# Patient Record
Sex: Female | Born: 1980 | Race: White | Hispanic: No | Marital: Married
Health system: Southern US, Community
[De-identification: ages and names within clinical notes are randomized; demographics above are authoritative.]

## PROBLEM LIST (undated history)

## (undated) HISTORY — PX: TUBAL LIGATION: SHX77

---

## 2006-09-01 ENCOUNTER — Emergency Department: Payer: Self-pay | Admitting: Emergency Medicine

## 2007-06-23 ENCOUNTER — Inpatient Hospital Stay: Payer: Self-pay | Admitting: Internal Medicine

## 2007-06-23 ENCOUNTER — Other Ambulatory Visit: Payer: Self-pay

## 2007-06-24 ENCOUNTER — Inpatient Hospital Stay: Payer: Self-pay | Admitting: Psychiatry

## 2008-11-29 ENCOUNTER — Emergency Department: Payer: Self-pay | Admitting: Emergency Medicine

## 2009-07-02 IMAGING — CT CT HEAD WITHOUT CONTRAST
2 series · 16 of 30 positions shown, 20 images · non-contrast
Comparison: none

REASON FOR EXAM: AMS
COMMENTS:

[Series 2: without · axial · non-contrast · 0.39mm/px · z∈[+887,+1007]mm · 13 of 30 slices shown, 17 images]
[im 3/30  brain]
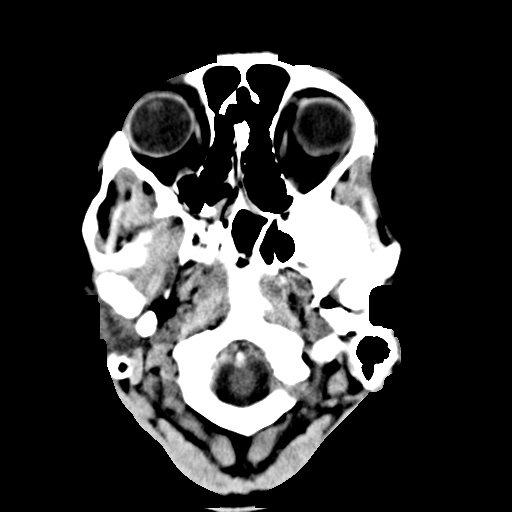
[im 3/30  bone]
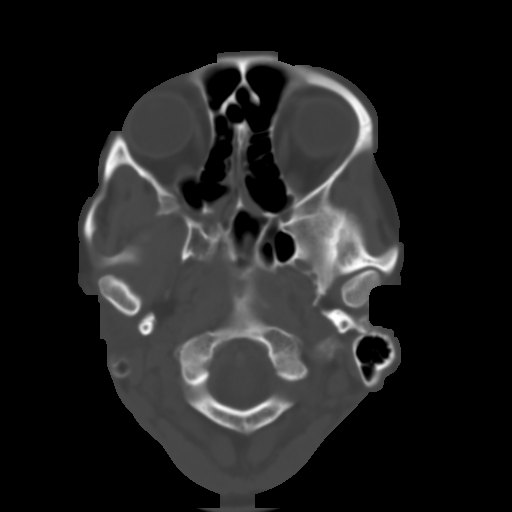
[im 5/30  brain]
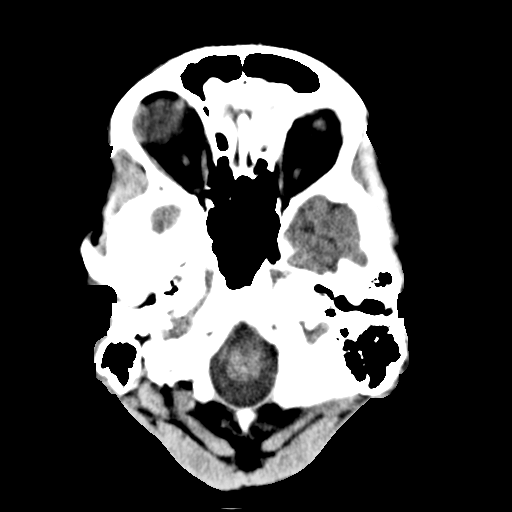
[im 7/30  brain]
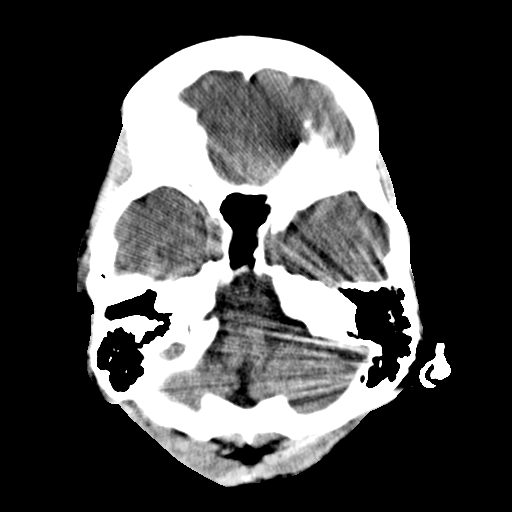
[im 9/30  brain]
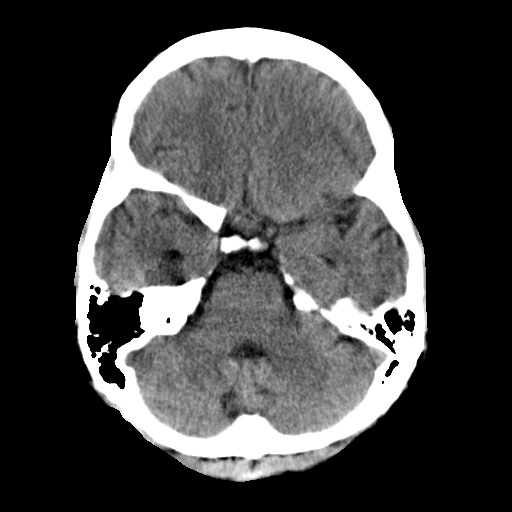
[im 11/30  brain]
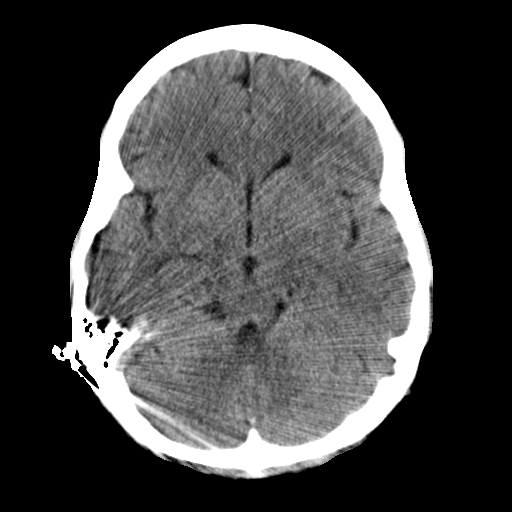
[im 11/30  bone]
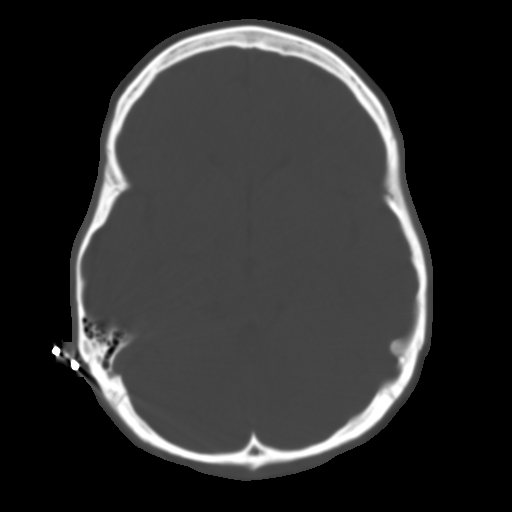
[im 13/30  brain]
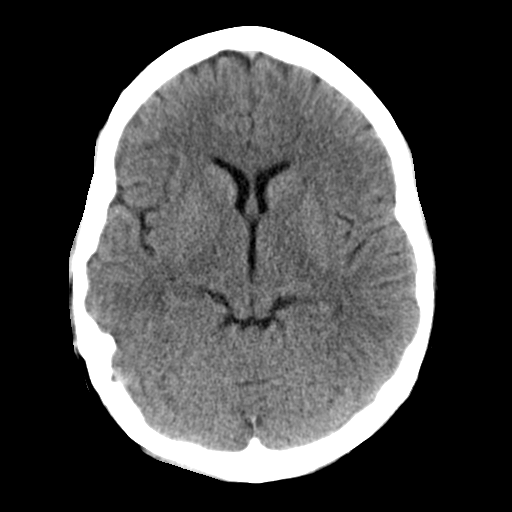
[im 15/30  brain]
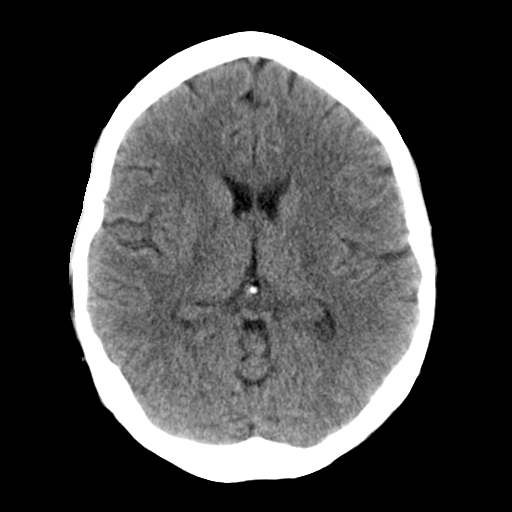
[im 17/30  brain]
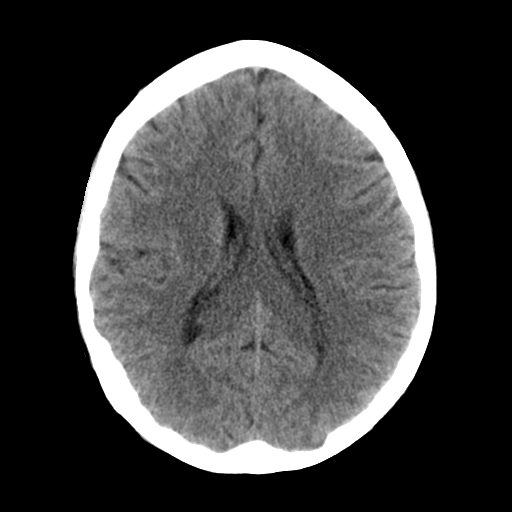
[im 19/30  brain]
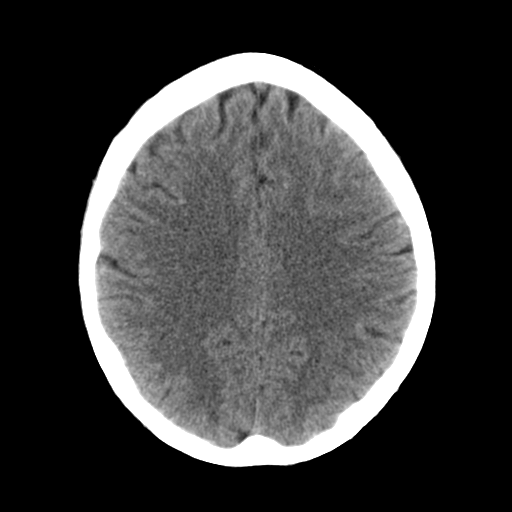
[im 19/30  bone]
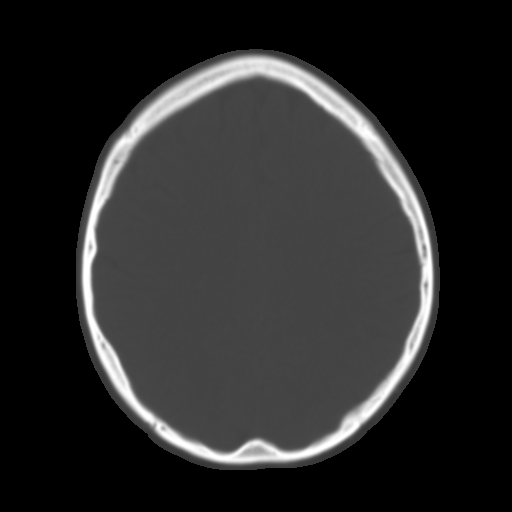
[im 21/30  brain]
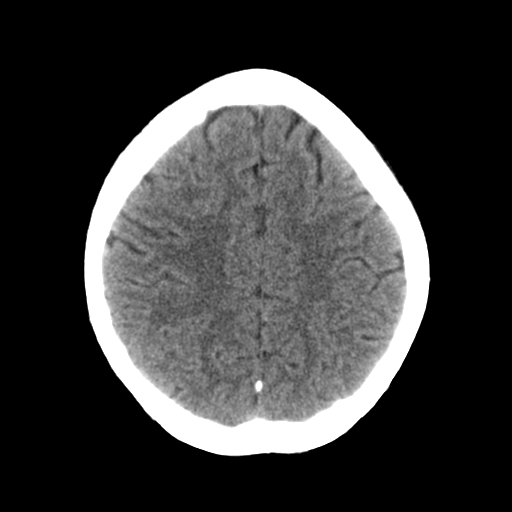
[im 23/30  brain]
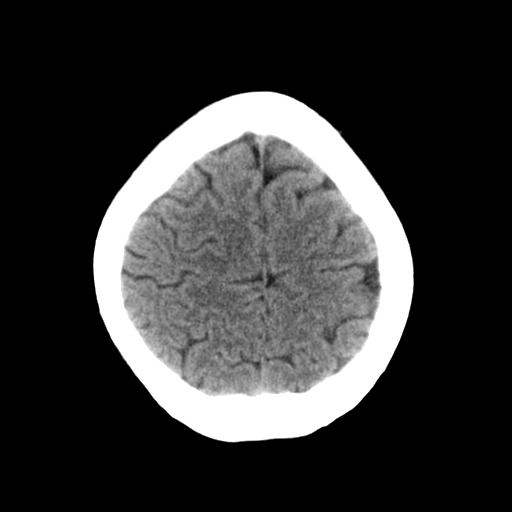
[im 25/30  brain]
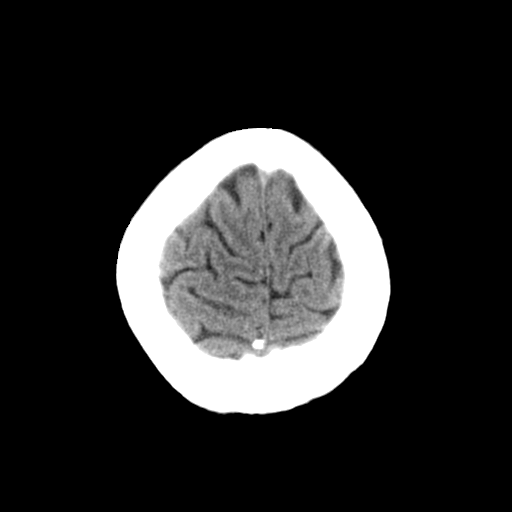
[im 27/30  brain]
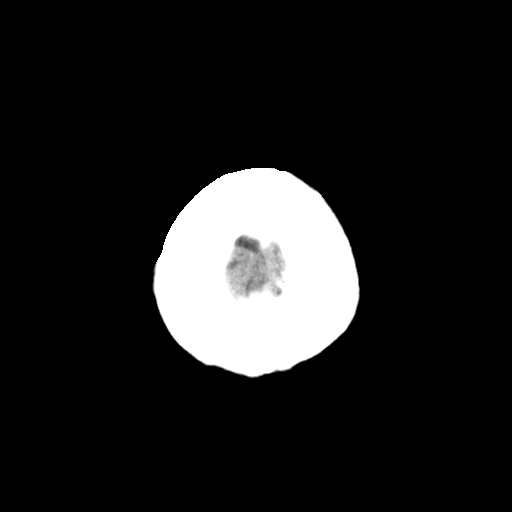
[im 27/30  bone]
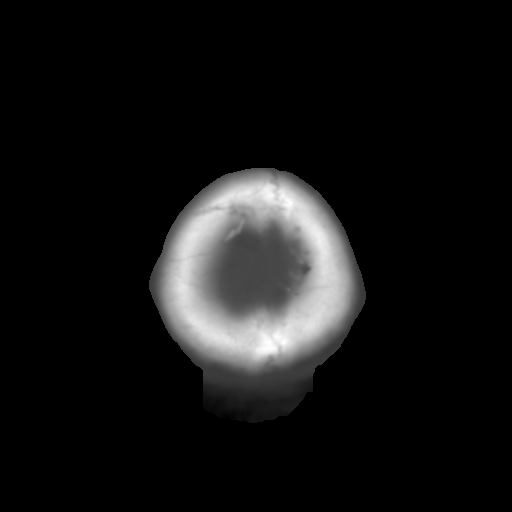

[Series 3: bone · axial · 0.39mm/px · z∈[+887,+927]mm · 3 of 30 slices shown]
[im 3/30  bone]
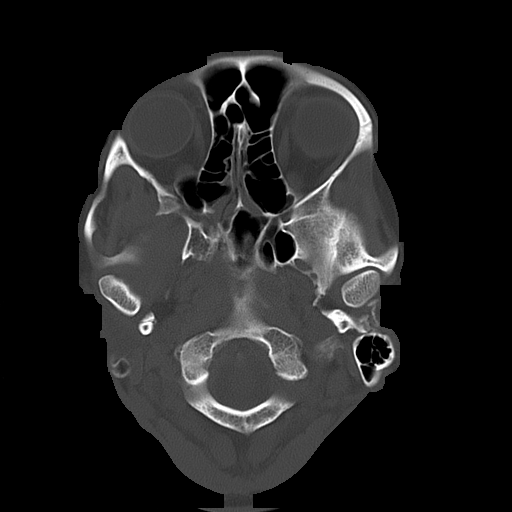
[im 7/30  bone]
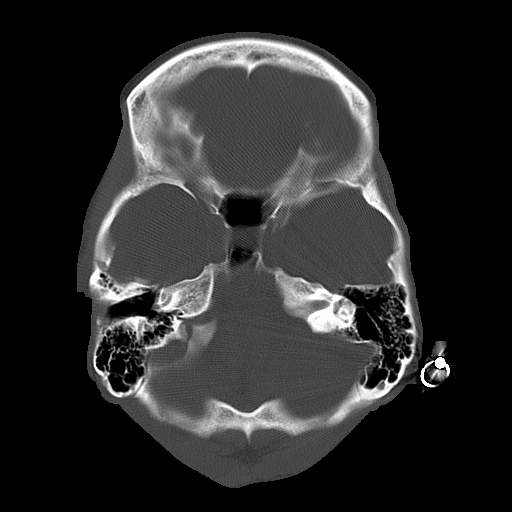
[im 11/30  bone]
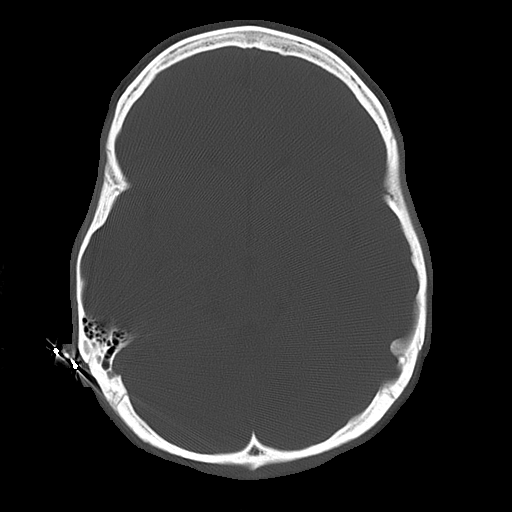

[16 of 30 positions shown; findings below may reference images not displayed]

PROCEDURE:     CT  - CT HEAD WITHOUT CONTRAST  - June 23, 2007  [DATE]

RESULT:     Comparison is made to a study 02 September, 2006.

The ventricles are normal in size and position. There is no intracranial
hemorrhage, mass, or mass-effect. There is some limitation of the study
along the base of the brain due to a metallic ring in the upper aspect of
the right ear. At bone window settings I do not see evidence of an acute
skull fracture. The observed portions of the paranasal sinuses are clear.
IMPRESSION: 1. I do not see evidence of an acute ischemic or hemorrhagic event.
2. There is no evidence of an acute skull fracture.

This report was called to the emergency department the conclusion of study.

## 2009-07-02 IMAGING — CR DG CHEST 1V PORT
1 series · 1 of 1 positions shown · non-contrast
Comparison: none

REASON FOR EXAM: Post intubation
COMMENTS:

[view not recorded]
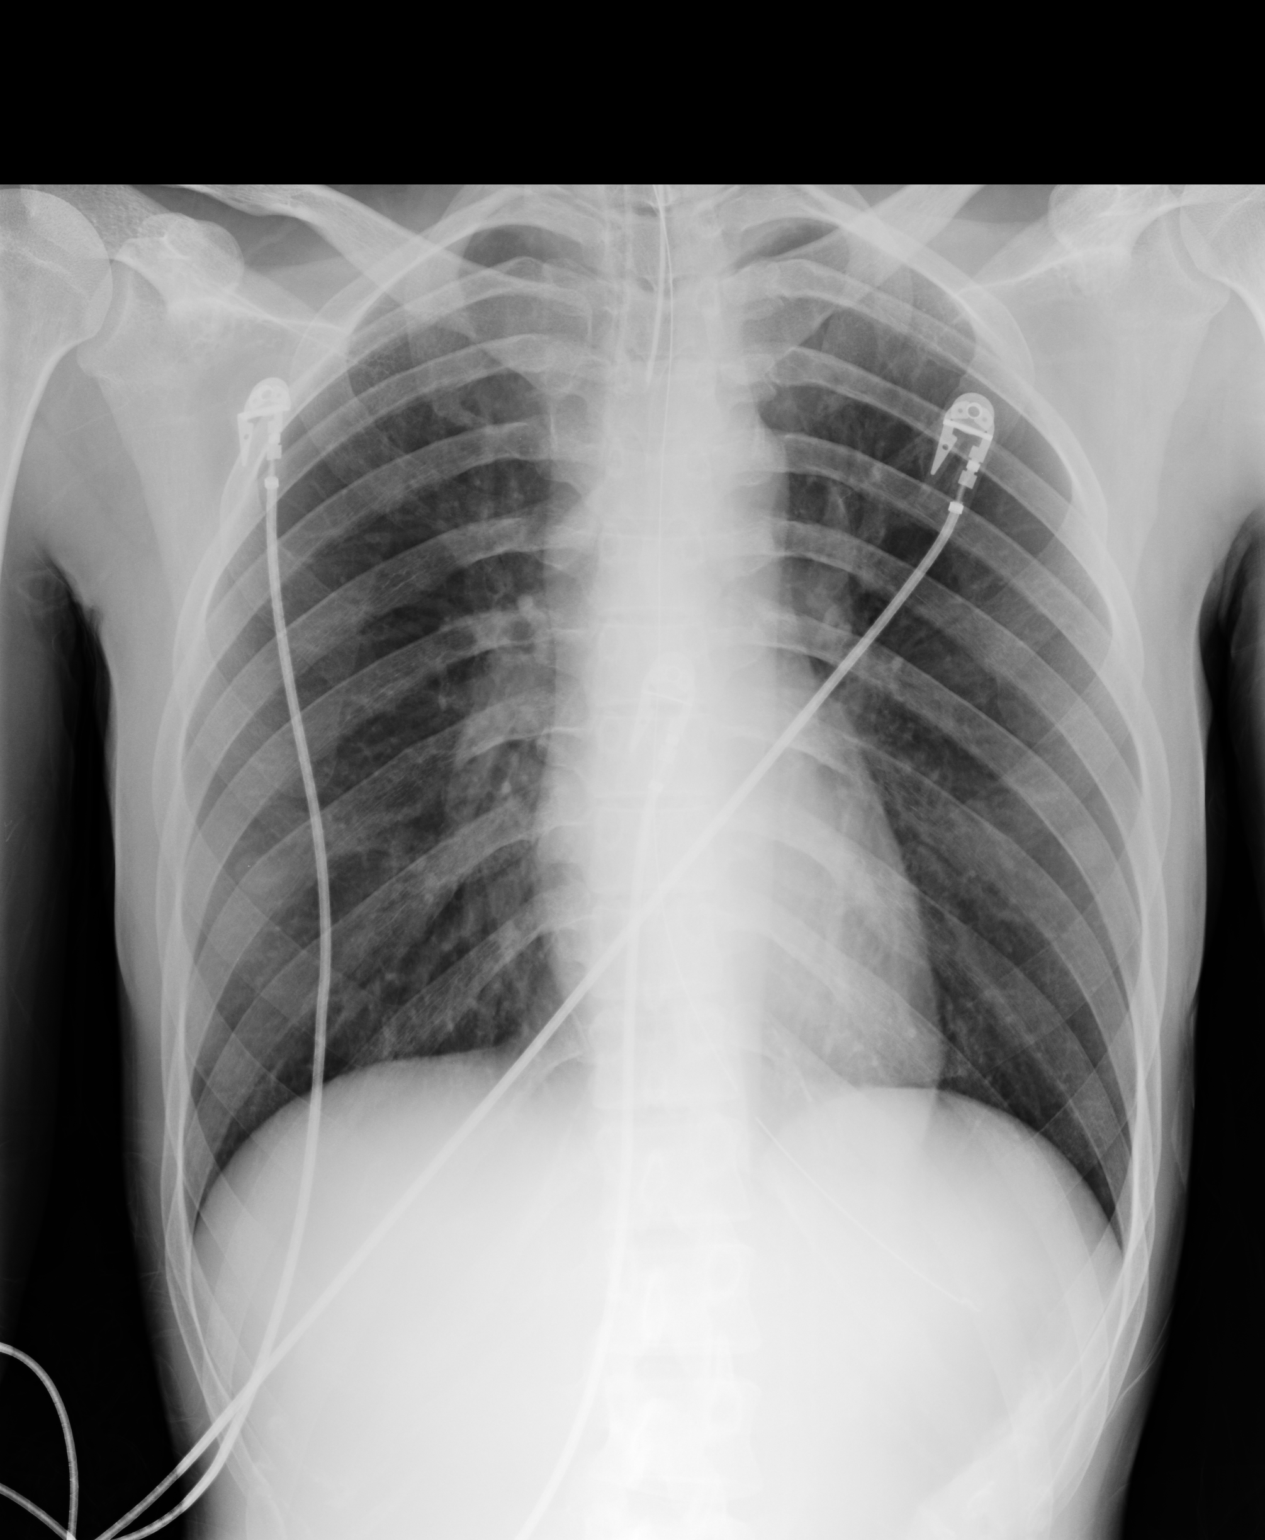

[1 of 1 positions shown; findings below may reference images not displayed]

PROCEDURE:     DXR - DXR PORTABLE CHEST SINGLE VIEW  - June 23, 2007 [DATE]

RESULT:     An endotracheal tube is present with the tip approximately
cm above the carina. A nasogastric tube is present with the distal portion
in the stomach. The lung fields are clear. The heart size is normal.
Monitoring electrodes are present.
IMPRESSION: Please see above.

## 2009-12-25 ENCOUNTER — Inpatient Hospital Stay: Payer: Self-pay

## 2012-09-04 ENCOUNTER — Other Ambulatory Visit: Payer: Self-pay | Admitting: Obstetrics and Gynecology

## 2012-09-10 ENCOUNTER — Ambulatory Visit: Payer: Self-pay | Admitting: Obstetrics and Gynecology

## 2012-09-10 LAB — CBC WITH DIFFERENTIAL/PLATELET
Basophil #: 0.1 10*3/uL (ref 0.0–0.1)
Eosinophil #: 0.2 10*3/uL (ref 0.0–0.7)
Eosinophil %: 1.5 %
HGB: 13.5 g/dL (ref 12.0–16.0)
Lymphocyte #: 1.4 10*3/uL (ref 1.0–3.6)
Lymphocyte %: 13.4 %
MCH: 30.8 pg (ref 26.0–34.0)
MCHC: 34.6 g/dL (ref 32.0–36.0)
MCV: 89 fL (ref 80–100)
Monocyte #: 0.7 x10 3/mm (ref 0.2–0.9)
Monocyte %: 6.8 %
Neutrophil #: 8 10*3/uL — ABNORMAL HIGH (ref 1.4–6.5)
Platelet: 306 10*3/uL (ref 150–440)
RBC: 4.37 10*6/uL (ref 3.80–5.20)
RDW: 13.4 % (ref 11.5–14.5)
WBC: 10.3 10*3/uL (ref 3.6–11.0)

## 2012-09-11 ENCOUNTER — Inpatient Hospital Stay: Payer: Self-pay | Admitting: Obstetrics and Gynecology

## 2012-09-14 LAB — PATHOLOGY REPORT

## 2014-08-05 NOTE — Op Note (Signed)
PATIENT NAME:  Bonnie Skinner, Bonnie Skinner MR#:  [id number] DATE OF BIRTH:  1981-01-17  DATE OF PROCEDURE:  09/13/2012  PREOPERATIVE DIAGNOSES: 1.  Postoperative day 2 after repeat low transverse cesarean.  2.  Wound hematoma throughout entire incision that was evacuated yesterday and packed last evening.   POSTOPERATIVE DIAGNOSES: 1.  Postoperative day 2 after repeat low transverse cesarean.  2.  Wound hematoma throughout entire incision that was evacuated yesterday and packed last evening.  3.   Small arterial pumper near right apex of wound.   PROCEDURE:   1.  Evacuation of the hematoma.  2.  Ligation of arterial pumper.  3.  Closure of wound.   SURGEON: Ricky L. Logan BoresEvans, MD   ASSISTANT: Lawerance Sabalebbie Swist on the floor   ESTIMATED BLOOD LOSS: Minimal.   COMPLICATIONS: None.   SPECIMENS: None.   DRAINS: None.   PROCEDURE IN DETAIL: The patient has had a wound hematoma and has been followed since the evening of surgery, evacuated yesterday and packed.  This morning, we removed the  packing.  A small arterial pumper was seen and immediately toward the right apex. I reached a Hemostat to grasp it and on return it had already spasmed shut; therefore, we did gathering sutures in the cephalad and caudad  portion in this region and no further oozing was noted. The packing appeared to do an acceptable job of clearing the edges, and the incision was closed with deep vertical mattress sutures of 3-0 Vicryl, and then a running subcuticular 4-0 Vicryl was used to close the skin, and pressure dressing was applied.   The patient tolerated the procedure well. The wound was prepped with Betadine and numbed with approximately 25 mL of 1% lidocaine, 21 of which had epinephrine, 4 push did not.  She tolerated the procedure very well, and I anticipated a routine postoperative course.   We informed the patient that there was at least a 50-50 chance we here going to have a persistent wound complication; the reason for  going through today's closure would be that hopefully any wound disruption would be smaller and localized instead of an entire wound as was the situation preop.   ____________________________ Clide Clifficky L. Logan BoresEvans, MD rle:cb Skinner: 09/13/2012 13:04:53 ET T: 09/13/2012 23:20:02 ET JOB#: [id number]  cc: Ricky L. Logan BoresEvans, MD, <Dictator> Augustina MoodICK L Renia Mikelson MD ELECTRONICALLY SIGNED 09/19/2012 11:09

## 2014-08-05 NOTE — Op Note (Signed)
PATIENT NAME:  Bonnie Skinner, [name] MR#:  [id number] DATE OF BIRTH:  19-Apr-1980  DATE OF PROCEDURE:  09/11/2012  PREOPERATIVE DIAGNOSES: 1.  Term gestation at 37 weeks and 2 days. 2.  Pregnancy-induced hypertension with history of severe preeclampsia at 33 weeks with previous pregnancy.  3.  Desires repeat cesarean section.  4.  Desires tubal ligation.   POSTOPERATIVE DIAGNOSES:  1.  Term gestation at 37 weeks and 2 days. 2.  Pregnancy-induced hypertension with history of severe preeclampsia at 33 weeks with previous pregnancy.  3.  Desires repeat cesarean section.  4.  Desires tubal ligation.   PROCEDURE: Repeat low transverse cesarean section and bilateral tubal ligation.   SURGEON: Ricky L. Logan BoresEvans, MD  ASSISTANT: Scrub.   ANESTHESIA: Spinal by Dr. Darleene CleaverVan Staveren.  FINDINGS: Postsurgical abdomen with moderate amount of scar tissue, grossly normal uterus, tubes and ovaries. Vigorous female infant weighing 3110 grams, Apgars 9 and 9.   ESTIMATED BLOOD LOSS: 650.   DRAINS:  Foley.   PAIN PUMP:  On-Q pump was placed.   SPECIMENS: Portion of bilateral fallopian tubes.  PROCEDURE IN DETAIL: The patient had been followed in the office for worsening blood pressure and this week her blood pressure was 150/98 with a previous being 144/89. Assessment for preeclampsia was negative. The patient was asymptomatic, but given good dating, her history of severe preeclampsia with previous pregnancy and rapidly worsening pressures elected to proceed with delivery. Discussed with the patient and family. They stated their understanding and desired to proceed. Consent was signed. She was taken to the operating room and placed in the supine position where anesthesia was initiated. She was then placed in the supine position, prepped and draped in the usual sterile fashion, and after insertion of Foley catheter and ensuring adequate anesthesia low transverse cesarean section was carried out in the usual fashion.  The uterus was exteriorized. The uterine cavity was curettaged with lap. The uterine incision was closed with running interlocking 0 chromic.   Bilateral tubal ligation was carried out in the usual fashion with double ligation of plain gut in a relatively avascular region in the mid ampullary portion of the tubes with portions sent for pathology.   Our attention was turned to the abdomen cavity, copiously irrigated, and surgical sites x 3 were seen to be hemostatic. The rectus muscle was hemostatic. The fascia was closed left to right with 0 Vicryl, sub-Q made hemostatic with cautery. Please note that the On-Q pump was placed subfascially prior to fascial closure. Skin was closed with surgical clips and silver catheters for the On-Q were fixated with skin glue, Steri-Strips and Tegaderm in the usual fashion. Catheters were bolused with 3 mL of 0.5% Sensorcaine.   The patient tolerated the procedure well. Two grams of Ancef was given IV preoperatively. I anticipate a routine postoperative course.  ____________________________ Reatha Harpsicky L. Logan BoresEvans, MD rle:sb D: 09/11/2012 11:52:27 ET T: 09/11/2012 12:19:32 ET JOB#: [id number]  cc: Ricky L. Logan BoresEvans, MD, <Dictator> Augustina MoodICK L Han Vejar MD ELECTRONICALLY SIGNED 09/12/2012 11:17

## 2016-08-25 ENCOUNTER — Emergency Department
Admission: EM | Admit: 2016-08-25 | Discharge: 2016-08-25 | Disposition: A | Discharge status: Home / Self Care | Payer: Managed Care, Other (non HMO) | Attending: Emergency Medicine | Admitting: Emergency Medicine

## 2016-08-25 ENCOUNTER — Encounter: Payer: Self-pay | Admitting: *Deleted

## 2016-08-25 DIAGNOSIS — S61215A Laceration without foreign body of left ring finger without damage to nail, initial encounter: Secondary | ICD-10-CM

## 2016-08-25 DIAGNOSIS — Z23 Encounter for immunization: Secondary | ICD-10-CM | POA: Insufficient documentation

## 2016-08-25 DIAGNOSIS — Y939 Activity, unspecified: Secondary | ICD-10-CM | POA: Insufficient documentation

## 2016-08-25 DIAGNOSIS — Y929 Unspecified place or not applicable: Secondary | ICD-10-CM | POA: Insufficient documentation

## 2016-08-25 DIAGNOSIS — W260XXA Contact with knife, initial encounter: Secondary | ICD-10-CM | POA: Diagnosis not present

## 2016-08-25 DIAGNOSIS — Y998 Other external cause status: Secondary | ICD-10-CM | POA: Insufficient documentation

## 2016-08-25 MED ORDER — BACITRACIN ZINC 500 UNIT/GM EX OINT
TOPICAL_OINTMENT | Freq: Two times a day (BID) | CUTANEOUS | Status: DC
  Administered 2016-08-25: 1 via TOPICAL
  Filled 2016-08-25: qty 0.9

## 2016-08-25 MED ORDER — TETANUS-DIPHTH-ACELL PERTUSSIS 5-2.5-18.5 LF-MCG/0.5 IM SUSP
0.5000 mL | Freq: Once | INTRAMUSCULAR | Status: AC
  Administered 2016-08-25: 0.5 mL via INTRAMUSCULAR
  Filled 2016-08-25: qty 0.5

## 2016-08-25 MED ORDER — LIDOCAINE HCL (PF) 1 % IJ SOLN
5.0000 mL | Freq: Once | INTRAMUSCULAR | Status: AC
  Administered 2016-08-25: 5 mL
  Filled 2016-08-25: qty 5

## 2016-08-25 MED ORDER — BACITRACIN ZINC 500 UNIT/GM EX OINT
TOPICAL_OINTMENT | CUTANEOUS | Status: AC
  Administered 2016-08-25: 1
  Filled 2016-08-25: qty 0.9

## 2016-08-25 NOTE — Discharge Instructions (Signed)
Return to primary care or emergency department for suture removal. If wound begins to show signs of infection return to the emergency department or follow up with your primary care provider.

## 2016-08-25 NOTE — ED Provider Notes (Signed)
[facility] Emergency Department Provider Note   ____________________________________________   I have reviewed the triage vital signs and the nursing notes.   HISTORY  Chief Complaint Laceration    HPI Bonnie Skinner is a 36 y.o. female presents with laceration to the left ring finger along the pad lateral to the nail bed. Patient reports slicing an onion and catching the tip of her finger with a kitchen knife. No nail or nail bed involvement. Patient denies any other injuries. Patient maintained hemorrhage control immediately following the injury. No cutaneous sensation deficits noted of the left hand digits.   History reviewed. No pertinent past medical history.  There are no active problems to display for this patient.   No past surgical history on file.  Prior to Admission medications   Not on File    Allergies Azithromycin  History reviewed. No pertinent family history.  Social History Social History  Substance Use Topics  . Smoking status: Not on file  . Smokeless tobacco: Not on file  . Alcohol use Not on file    Review of Systems Constitutional:  Negative for fever/chills Cardiovascular: Denies chest pain. Respiratory: Denies cough Denies shortness of breath. Musculoskeletal: Negative musculoskeletal complaints Skin: Left ring finger laceration Neurological: Negative for headaches.  Negative focal weakness or numbness.  ____________________________________________   PHYSICAL EXAM:  VITAL SIGNS: ED Triage Vitals  Enc Vitals Group     BP 08/25/16 1813 137/83     Pulse Rate 08/25/16 1813 75     Resp 08/25/16 1813 16     Temp 08/25/16 1813 98.4 F (36.9 C)     Temp Source 08/25/16 1813 Oral     SpO2 08/25/16 1813 100 %     Weight 08/25/16 1813 170 lb (77.1 kg)     Height 08/25/16 1813 5\' 7"  (1.702 m)     Head Circumference --      Peak Flow --      Pain Score 08/25/16 1809 6     Pain Loc --      Pain Edu? --    Excl. in GC? --     Constitutional: Alert and oriented. Well appearing and in no acute distress.  Head: Normocephalic and atraumatic. Eyes: Conjunctivae are normal. Cardiovascular: Normal rate, regular rhythm. Normal distal pulses. Respiratory: Normal respiratory effort. Musculoskeletal: Nontender with normal range of motion in all extremities. Neurologic: Normal speech and language. No gross focal neurologic deficits are appreciated.  Skin:  Skin is warm, dry and intact except left 4th digit distal laceration, along pad without nail involvement. No rash noted. Psychiatric: Mood and affect are normal. Patient exhibits appropriate insight and judgment. ____________________________________________   LABS (all labs ordered are listed, but only abnormal results are displayed)  Labs Reviewed - No data to display ____________________________________________  EKG None ____________________________________________  RADIOLOGY None ____________________________________________   PROCEDURES  Procedure(s) performed: LACERATION REPAIR Performed by: Clois Comber Authorized by: Clois Comber Consent: Verbal consent obtained. Risks and benefits: risks, benefits and alternatives were discussed Consent given by: patient Patient identity confirmed: provided demographic data Prepped and Draped in normal sterile fashion Wound explored  Laceration Location: left 4th digit DIP, pad  Laceration Length: 1.5 cm, flap  No Foreign Bodies seen or palpated  Anesthesia: transthecal digital block of left 4th digit  Local anesthetic: lidocaine 1%  Anesthetic total: 2.0 ml  Irrigation method: syringe Amount of cleaning: standard  Skin closure: Ethilon suture 5-0  Number of sutures: 3-sutures  Technique: simple interrupted  Patient tolerance: Patient tolerated the procedure well with no immediate complications.   Critical Care performed:  no ____________________________________________   INITIAL IMPRESSION / ASSESSMENT AND PLAN / ED COURSE  Pertinent labs & imaging results that were available during my care of the patient were reviewed by me and considered in my medical decision making (see chart for details).  Patient sustained a laceration left 4th digit, distal tip, along pad without nail involvement. Assessment confirmed movement and sensation of the digit before and after wound closure. Laceration required suture closure as noted above. Patient tolerated procedure well. Pt instructed to keep wound clean and dry and will return to the emergency department or PCP for suture removal in 10 days. Patient also instructed to watch for signs of infection and return if she noted changes of such.    Patient / Family informed of clinical course, understand medical decision-making process, and agree with plan.  Patient was advised to follow up PCP and was also advised to return to the emergency department for symptoms that change or worsen if unable to schedule an appointment.      If controlled substance prescribed during this visit, 12 month history viewed on the [facility] prior to issuing an initial prescription for Schedule II or III opiod. ____________________________________________   FINAL CLINICAL IMPRESSION(S) / ED DIAGNOSES  Final diagnoses:  None       NEW MEDICATIONS STARTED DURING THIS VISIT:  New Prescriptions   No medications on file     Note:  This document was prepared using Dragon voice recognition software and may include unintentional dictation errors.   Percell BostonLittle, Traci M, PA-C 08/25/16 2025    Sharman CheekStafford, Phillip, MD 08/25/16 [phone]

## 2016-08-25 NOTE — ED Triage Notes (Signed)
Arrives with laceration to left ring finger, cut it on a knife while cooking, states she believes tetanus status is up to date

## 2016-08-25 NOTE — ED Notes (Signed)
See triage note  States she was cutting an onion and   Cut her left 4th finger

## 2021-11-01 ENCOUNTER — Ambulatory Visit
Admission: EM | Admit: 2021-11-01 | Discharge: 2021-11-01 | Disposition: A | Discharge status: Home / Self Care | Payer: 59 | Attending: Emergency Medicine | Admitting: Emergency Medicine

## 2021-11-01 ENCOUNTER — Ambulatory Visit: Payer: Self-pay

## 2021-11-01 DIAGNOSIS — N898 Other specified noninflammatory disorders of vagina: Secondary | ICD-10-CM | POA: Diagnosis not present

## 2021-11-01 DIAGNOSIS — B372 Candidiasis of skin and nail: Secondary | ICD-10-CM | POA: Insufficient documentation

## 2021-11-01 LAB — POCT URINALYSIS DIP (MANUAL ENTRY)
Bilirubin, UA: NEGATIVE
Glucose, UA: NEGATIVE mg/dL
Ketones, POC UA: NEGATIVE mg/dL
Leukocytes, UA: NEGATIVE
Nitrite, UA: NEGATIVE
Protein Ur, POC: NEGATIVE mg/dL
Spec Grav, UA: <=1.005 — AB (ref 1.010–1.025)
Urobilinogen, UA: 0.2 E.U./dL
pH, UA: 5.5 (ref 5.0–8.0)

## 2021-11-01 LAB — POCT FASTING CBG KUC MANUAL ENTRY: POCT Glucose (KUC): 93 mg/dL (ref 70–99)

## 2021-11-01 LAB — POCT URINE PREGNANCY: Preg Test, Ur: NEGATIVE

## 2021-11-01 MED ORDER — NYSTATIN 100000 UNIT/GM EX CREA: TOPICAL_CREAM | CUTANEOUS | 1 refills | Status: AC

## 2021-11-01 NOTE — Discharge Instructions (Addendum)
The vaginal tests are pending.  Use the Nystatin cream as directed.  Follow up with your gynecologist.

## 2021-11-01 NOTE — Telephone Encounter (Signed)
Summary: Vaginal Itching   Pt called in to get a new pt appt, none available until 9/20. Pt has been experiencing vaginal itching since 7/4. Pt did virtual visit on 7/6 and was given Fluconazole to take and has been using diaper rash creams. Pt said symptoms are not getting any better. Please advise pt.      Chief Complaint: itching Symptoms: rash to vaginal folds Frequency: since 10/13/21 Pertinent Negatives: Patient denies fever, vaginal bleeding, pain with urination, injury or foreign Disposition: [] ED /[x] Urgent Care (no appt availability in office) / [] Appointment(In office/virtual)/ []  [facility]/ [] Home Care/ [] Refused Recommended Disposition /[] [place] [facility] [place] [facility]/ []  Follow-up with PCP Additional Notes: pt stated that she was dx with URI and given abx. Pt took 2 rounds of  Fluconazole as well as Monistat.  Pt had 2 virtual visits but feels that rash needs to be seen. Pt stated d/c is decreased but itching is biggest complaint.  New pt appt made with FNP. Pt appt put on wait list. Pt to UC for acute needs.  Reason for Disposition  MODERATE-SEVERE itching (i.e., interferes with school, work, or sleep)  Answer Assessment - Initial Assessment Questions 1. SYMPTOM: "What's the main symptom you're concerned about?" (e.g., pain, itching, dryness)     itching 2. LOCATION: "Where is the  itching located?" (e.g., inside/outside, left/right)     Vaginal folds 3. ONSET: "When did the  itching  start?"     July 1 st 4. PAIN: "Is there any pain?" If Yes, ask: "How bad is it?" (Scale: 1-10; mild, moderate, severe)   -  MILD (1-3): Doesn't interfere with normal activities.    -  MODERATE (4-7): Interferes with normal activities (e.g., work or school) or awakens from sleep.     -  SEVERE (8-10): Excruciating pain, unable to do any normal activities.     no 5. ITCHING: "Is there any itching?" If Yes, ask: "How bad is it?" (Scale: 1-10; mild, moderate, severe)     Severe  7-8 6. CAUSE: "What do you think is causing the discharge?" "Have you had the same problem before? What happened then?"     N/a 7. OTHER SYMPTOMS: "Do you have any other symptoms?" (e.g., fever, itching, vaginal bleeding, pain with urination, injury to genital area, vaginal foreign body)     Feels rash, red to purple in color  Protocols used: Vaginal Symptoms-A-AH

## 2021-11-01 NOTE — ED Provider Notes (Signed)
Renaldo Fiddler    CSN: [id number] Arrival date & time: 11/01/21  1909      History   Chief Complaint Chief Complaint  Patient presents with   Vaginal Itching    HPI Bonnie Skinner is a 41 y.o. female.  Patient presents with vaginal itching red rash on labia x1 month.  No vaginal discharge, pelvic pain, abdominal pain, dysuria, hematuria, flank pain, or other symptoms.  Patient was seen at [facility] clinic on 09/15/2021; diagnosed with candidal vaginitis, acute sinusitis, sore throat; treated with Diflucan, Augmentin, Tessalon Perles.  She also reports virtual visit on 10/18/2021 and treated with Diflucan.  The history is provided by the patient and medical records.    History reviewed. No pertinent past medical history.  There are no problems to display for this patient.   Past Surgical History:  Procedure Laterality Date   TUBAL LIGATION      OB History   No obstetric history on file.      Home Medications    Prior to Admission medications   Medication Sig Start Date End Date Taking? Authorizing Provider  levonorgestrel (MIRENA) 20 MCG/DAY IUD 1 each by Intrauterine route once.   Yes [provider]  nystatin cream (MYCOSTATIN) Apply to affected area 2 times daily 11/01/21  Yes Mickie Bail, NP    Family History No family history on file.  Social History Social History   Substance Use Topics   Alcohol use: Yes    Comment: socially   Drug use: Never     Allergies   Azithromycin and Bactrim [sulfamethoxazole-trimethoprim]   Review of Systems Review of Systems  Constitutional:  Negative for chills and fever.  HENT:  Negative for ear pain and sore throat.   Respiratory:  Negative for cough and shortness of breath.   Cardiovascular:  Negative for chest pain and palpitations.  Gastrointestinal:  Negative for abdominal pain, diarrhea and vomiting.  Genitourinary:  Negative for dysuria, hematuria, pelvic pain and vaginal discharge.        Vaginal itching and red labial rash.  All other systems reviewed and are negative.    Physical Exam Triage Vital Signs ED Triage Vitals  Enc Vitals Group     BP      Pulse      Resp      Temp      Temp src      SpO2      Weight      Height      Head Circumference      Peak Flow      Pain Score      Pain Loc      Pain Edu?      Excl. in GC?    No data found.  Updated Vital Signs BP 130/85 (BP Location: Left Arm)   Pulse 68   Temp 98.4 F (36.9 C) (Oral)   Resp 20   SpO2 97%   Visual Acuity Right Eye Distance:   Left Eye Distance:   Bilateral Distance:    Right Eye Near:   Left Eye Near:    Bilateral Near:     Physical Exam Vitals and nursing note reviewed. Exam conducted with a chaperone present Doyne Keel, RN).  Constitutional:      General: She is not in acute distress.    Appearance: She is well-developed.  HENT:     Head: Normocephalic and atraumatic.     Mouth/Throat:     Mouth: Mucous membranes  are moist.  Cardiovascular:     Rate and Rhythm: Normal rate and regular rhythm.     Heart sounds: Normal heart sounds.  Pulmonary:     Effort: Pulmonary effort is normal. No respiratory distress.     Breath sounds: Normal breath sounds.  Abdominal:     General: Bowel sounds are normal.     Palpations: Abdomen is soft.     Tenderness: There is no abdominal tenderness. There is no right CVA tenderness, left CVA tenderness, guarding or rebound.  Genitourinary:    Vagina: No vaginal discharge.     Comments: Erythematous labial rash.  No lesions.  No vaginal discharge. Musculoskeletal:        General: No swelling.     Cervical back: Neck supple.  Skin:    General: Skin is warm and dry.  Neurological:     Mental Status: She is alert.  Psychiatric:        Mood and Affect: Mood normal.        Behavior: Behavior normal.      UC Treatments / Results  Labs (all labs ordered are listed, but only abnormal results are displayed) Labs Reviewed  POCT  URINALYSIS DIP (MANUAL ENTRY) - Abnormal; Notable for the following components:      Result Value   Spec Grav, UA <=1.005 (*)    Blood, UA trace-intact (*)    All other components within normal limits  POCT URINE PREGNANCY  POCT FASTING CBG [facility] MANUAL ENTRY  CERVICOVAGINAL ANCILLARY ONLY    EKG   Radiology No results found.  Procedures Procedures (including critical care time)  Medications Ordered in UC Medications - No data to display  Initial Impression / Assessment and Plan / UC Course  I have reviewed the triage vital signs and the nursing notes.  Pertinent labs & imaging results that were available during my care of the patient were reviewed by me and considered in my medical decision making (see chart for details).    Vaginal itching, Candidal dermatitis.  Patient obtained vaginal self swab for testing.  Treating with Nystatin cream.  Discussed that we will call if test results are positive.  Discussed that she may require treatment at that time.  Instructed her to follow-up with her gynecologist if her symptoms are not improving.  Patient agrees to plan of care.   Final Clinical Impressions(s) / UC Diagnoses   Final diagnoses:  Vaginal itching  Candidal dermatitis     Discharge Instructions      The vaginal tests are pending.  Use the Nystatin cream as directed.  Follow up with your gynecologist.         ED Prescriptions     Medication Sig Dispense Auth. Provider   nystatin cream (MYCOSTATIN) Apply to affected area 2 times daily 30 g Mickie Bail, NP      PDMP not reviewed this encounter.   Mickie Bail, NP 11/01/21 1958

## 2021-11-01 NOTE — ED Triage Notes (Signed)
Pt presents with vaginal itching x 1 month. Has been treated for yeast infection with Diflucan twice, first time in June then again approx 07/06 (with one pill then another 3 days later).  No improvement in itching.  Has not had discharge with it at any time.  States the skin is flaking off.

## 2021-11-05 ENCOUNTER — Telehealth (HOSPITAL_COMMUNITY): Payer: Self-pay | Admitting: Emergency Medicine

## 2021-11-05 LAB — CERVICOVAGINAL ANCILLARY ONLY
Bacterial Vaginitis (gardnerella): NEGATIVE
Candida Glabrata: NEGATIVE
Candida Vaginitis: POSITIVE — AB
Chlamydia: NEGATIVE
Comment: NEGATIVE
Comment: NEGATIVE
Comment: NEGATIVE
Comment: NEGATIVE
Comment: NEGATIVE
Comment: NORMAL
Neisseria Gonorrhea: NEGATIVE
Trichomonas: NEGATIVE

## 2021-11-05 MED ORDER — FLUCONAZOLE 150 MG PO TABS: 150.0000 mg | ORAL_TABLET | Freq: Once | ORAL | 0 refills | Status: AC

## 2022-01-02 ENCOUNTER — Ambulatory Visit: Payer: Self-pay | Admitting: Family Medicine

## 2023-12-24 ENCOUNTER — Encounter: Payer: Self-pay | Admitting: Dietician

## 2023-12-24 ENCOUNTER — Encounter: Attending: Student | Admitting: Dietician

## 2023-12-24 VITALS — Ht 67.0 in | Wt 192.7 lb

## 2023-12-24 DIAGNOSIS — E66811 Obesity, class 1: Secondary | ICD-10-CM | POA: Diagnosis not present

## 2023-12-24 DIAGNOSIS — R030 Elevated blood-pressure reading, without diagnosis of hypertension: Secondary | ICD-10-CM | POA: Diagnosis not present

## 2023-12-24 DIAGNOSIS — Z6830 Body mass index (BMI) 30.0-30.9, adult: Secondary | ICD-10-CM | POA: Diagnosis not present

## 2023-12-24 NOTE — Progress Notes (Signed)
 Medical Nutrition Therapy: Visit start time: 0930  end time: 1030  Assessment:   Referral Diagnosis: elevated BP, overweight/ obesity Other medical history/ diagnoses: prediabetes Psychosocial issues/ stress concerns: anxiety  Medications, supplements: reconciled list in medical record   Current weight: 192.7lbs Height: 5'7 BMI: 30.18  Progress and evaluation:  Patient reports some elevated BP readings over the past year; also had preeclampsia with pregnancies in the past, and reports similar symptoms recently. Has had difficulty maintaining weight loss diets in the past due to ADD, so success has been limited and short term.   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 Weight is down about 8-10lbs Schedule is busy with work and kids' activities, pt describes as chaotic at times Eating out less currently; using meal planning app Food allergies: cinnamon Recent lab results: 12/02/23 HbA1C 5.7%, glucose 94; total chol 164, HDL 62.6, LDL 86, Triglycerides 78    Dietary Intake:  Usual eating pattern includes 2 meals and 0-1 snacks per day. Dining out frequency: 4 meals per week. Who plans meals/ buys groceries? self Who prepares meals? self  Breakfast: usually skips, forgets -- not hungry  Snack: none Lunch: leftover; sandwich with [place]; burrito; frozen pizza Snack: sometimes chips/ pretzels/ nutella/ fruit Supper: crock pot tacos; grilled chicken and mashed potatoes; homemade soup; spaghetti  Snack: usually none; occ cookies/ choc chip granola bar Beverages: water, coffee/ energy drink in am; does not like soda/ juice  Physical activity: walk (has enjoyed running in the past) -- not consistent; scouting activities   Intervention:   Nutrition Care Education:   Basic nutrition: general nutrition guidelines    Weight control: importance of low sugar and low fat choices; portion control especially of higher-calorie density foods;  including sources of fiber for fulness, adequate protien Advanced nutrition:  cooking techniques for limited time Hypertension: identifying high sodium foods, goal for sodium intake; identifying food sources of potassium, magnesium; options for seasoning foods; DASH diet guidelines  Other intervention notes: Patient has been working on positive and appropriate diet changes and is motivated to continue.  Established additional goals for change with direction from patient.   Nutritional Diagnosis:  [place]-2.1 Inpaired nutrition utilization As related to elevated BP.  As evidenced by several elevated results over past months. [place]-3.3 Overweight/obesity As related to excess calories, inadequate physical activity.  As evidenced by history of frequent restaurant meals, high fat snacks.   Education Materials given:  General diet guidelines for Hypertension/ DASH dietary pattern Carb-mindful Smoothie recipe template Visit summary with goals/ instructions to be viewed via patient portal   Learner/ who was taught:  Patient   Level of understanding: Verbalizes/ demonstrates competency  Demonstrated degree of understanding via:   Teach back Learning barriers: None  Willingness to learn/ readiness for change: Eager, change in progress   Monitoring and Evaluation:  Dietary intake, exercise, and body weight      follow up: 02/18/24

## 2023-12-24 NOTE — Patient Instructions (Addendum)
 Great job making positive and suitable changes Incorporate more fruit and veggies by including smoothie option for a breakfast or snack Plan to include a veg and/or fruit with each meal. Be generous with portions of these foods. Work to eat fish 2 times a week or more.  Keep up some regular exercise, even if a few minutes, most days of the week.

## 2024-02-18 ENCOUNTER — Ambulatory Visit: Admitting: Dietician

## 2024-03-22 ENCOUNTER — Encounter: Payer: Self-pay | Admitting: Dietician

## 2024-05-18 ENCOUNTER — Other Ambulatory Visit: Payer: Self-pay | Admitting: Student

## 2024-05-18 DIAGNOSIS — Z1231 Encounter for screening mammogram for malignant neoplasm of breast: Secondary | ICD-10-CM

## 2024-05-21 ENCOUNTER — Inpatient Hospital Stay: Admission: RE | Admit: 2024-05-21

## 2024-05-21 DIAGNOSIS — Z1231 Encounter for screening mammogram for malignant neoplasm of breast: Secondary | ICD-10-CM
